# Patient Record
Sex: Female | Born: 2000 | Hispanic: Yes | Marital: Single | Smoking: Never smoker
Health system: Southern US, Community
[De-identification: ages and names within clinical notes are randomized; demographics above are authoritative.]

---
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
## 2022-09-24 ENCOUNTER — Other Ambulatory Visit: Payer: Self-pay

## 2022-09-24 ENCOUNTER — Ambulatory Visit
Admission: EM | Admit: 2022-09-24 | Discharge: 2022-09-24 | Disposition: A | Discharge status: Home / Self Care | Payer: Self-pay

## 2022-09-24 DIAGNOSIS — B070 Plantar wart: Secondary | ICD-10-CM

## 2022-09-24 NOTE — ED Triage Notes (Signed)
Patient to Urgent Care with complaints of left heel pain. Reports that initially she had swelling and pain in her heel, she tried to pop the area but now it is painful to even walk/ stand. Concerned about a possible wart.

## 2022-09-24 NOTE — Discharge Instructions (Addendum)
Follow up with a podiatrist or dermatologist.  [facility] [address], [place], [place] [address] [phone]

## 2022-09-24 NOTE — ED Provider Notes (Signed)
[name]    CSN: [id number] Arrival date & time: 09/24/22  1317      History   Chief Complaint Chief Complaint  Patient presents with   Foot Pain    HPI Brittany Guzman is a 21 y.o. female.  Patient presents with pain on the bottom of her left foot x1 to 2 months.  No trauma.  She is concerned for a possible wart.  The area is painful to bear weight and ambulate.  No drainage, fever, chills, numbness, weakness, paresthesias, or other symptoms.  No treatments at home.  The history is provided by the patient.    History reviewed. No pertinent past medical history.  There are no problems to display for this patient.   History reviewed. No pertinent surgical history.  OB History   No obstetric history on file.      Home Medications    Prior to Admission medications   Not on File    Family History No family history on file.  Social History Social History   Tobacco Use   Smoking status: Never   Smokeless tobacco: Never  Substance Use Topics   Alcohol use: Never   Drug use: Never     Allergies   Patient has no known allergies.   Review of Systems Review of Systems  Constitutional:  Negative for chills and fever.  Musculoskeletal:  Positive for gait problem. Negative for arthralgias and joint swelling.  Skin:  Positive for wound. Negative for color change.  Neurological:  Negative for weakness and numbness.  All other systems reviewed and are negative.    Physical Exam Triage Vital Signs ED Triage Vitals  Enc Vitals Group     BP 09/24/22 1423 123/78     Pulse Rate 09/24/22 1423 69     Resp 09/24/22 1423 18     Temp 09/24/22 1423 98.1 F (36.7 C)     Temp src --      SpO2 09/24/22 1423 98 %     Weight --      Height 09/24/22 1441 5\' 1"  (1.549 m)     Head Circumference --      Peak Flow --      Pain Score 09/24/22 1440 5     Pain Loc --      Pain Edu? --      Excl. in [place]? --    No data found.  Updated Vital Signs BP  123/78   Pulse 69   Temp 98.1 F (36.7 C)   Resp 18   Ht 5\' 1"  (1.549 m)   SpO2 98%   Visual Acuity Right Eye Distance:   Left Eye Distance:   Bilateral Distance:    Right Eye Near:   Left Eye Near:    Bilateral Near:     Physical Exam Vitals and nursing note reviewed.  Constitutional:      General: She is not in acute distress.    Appearance: Normal appearance. She is well-developed. She is not ill-appearing.  HENT:     Mouth/Throat:     Mouth: Mucous membranes are moist.  Cardiovascular:     Rate and Rhythm: Normal rate and regular rhythm.     Heart sounds: Normal heart sounds.  Pulmonary:     Effort: Pulmonary effort is normal. No respiratory distress.     Breath sounds: Normal breath sounds.  Musculoskeletal:        General: No swelling or deformity. Normal range of motion.  Cervical back: Neck supple.       Feet:  Skin:    General: Skin is warm and dry.     Capillary Refill: Capillary refill takes less than 2 seconds.     Findings: Lesion present.     Comments: 1 cm circular plantar wart on left foot. No open wound or drainage.   Neurological:     General: No focal deficit present.     Mental Status: She is alert and oriented to person, place, and time.     Sensory: No sensory deficit.     Motor: No weakness.     Gait: Gait normal.  Psychiatric:        Mood and Affect: Mood normal.        Behavior: Behavior normal.      UC Treatments / Results  Labs (all labs ordered are listed, but only abnormal results are displayed) Labs Reviewed - No data to display  EKG   Radiology No results found.  Procedures Procedures (including critical care time)  Medications Ordered in UC Medications - No data to display  Initial Impression / Assessment and Plan / UC Course  I have reviewed the triage vital signs and the nursing notes.  Pertinent labs & imaging results that were available during my care of the patient were reviewed by me and considered in  my medical decision making (see chart for details).   Plantar wart of left foot.  No indication of infection.  Education provided on plantar warts.  Instructed patient to follow-up with a podiatrist or dermatologist.  He agrees to plan of care.  Final Clinical Impressions(s) / UC Diagnoses   Final diagnoses:  Plantar wart of left foot     Discharge Instructions      Follow up with a podiatrist or dermatologist.  [facility] [address] [place], [place] [address] [phone]     ED Prescriptions   None    PDMP not reviewed this encounter.   Mickie Bail, NP 09/24/22 1520

## 2022-10-17 ENCOUNTER — Ambulatory Visit (INDEPENDENT_AMBULATORY_CARE_PROVIDER_SITE_OTHER): Payer: Self-pay | Admitting: Podiatry

## 2022-10-17 DIAGNOSIS — B070 Plantar wart: Secondary | ICD-10-CM

## 2022-10-17 NOTE — Progress Notes (Signed)
  Subjective:  Patient ID: Brittany Guzman, female    DOB: Dec 09, 2001,  MRN: [id number]  Chief Complaint  Patient presents with   Plantar Warts    21 y.o. female presents with the above complaint.  Patient presents with left submetatarsal fifth metatarsal base plantar verruca.  Patient states that it is painful to touch is progressive gotten worse she has tried some over-the-counter therapy none of which has helped.  She wanted to get it evaluated.  Hurts with ambulation hurts with pressure.   Review of Systems: Negative except as noted in the HPI. Denies N/V/F/Ch.  No past medical history on file. No current outpatient medications on file.  Social History   Tobacco Use  Smoking Status Never  Smokeless Tobacco Never    No Known Allergies Objective:  There were no vitals filed for this visit. There is no height or weight on file to calculate BMI. Constitutional Well developed. Well nourished.  Vascular Dorsalis pedis pulses palpable bilaterally. Posterior tibial pulses palpable bilaterally. Capillary refill normal to all digits.  No cyanosis or clubbing noted. Pedal hair growth normal.  Neurologic Normal speech. Oriented to person, place, and time. Epicritic sensation to light touch grossly present bilaterally.  Dermatologic Hyperkeratotic lesion with pinpoint bleeding noted to fifth metatarsal base.  Mild pain on palpation  Orthopedic: Normal joint ROM without pain or crepitus bilaterally. No visible deformities. No bony tenderness.   Radiographs: None Assessment:   1. Plantar verruca    Plan:  Patient was evaluated and treated and all questions answered.  Left fifth metatarsal base plantar verruca --Lesion was debrided today without complications. Hemostasis was achieved and the area was cleaned. Cantharone was applied followed by an occlusive bandage. Post procedure complications were discussed. Monitor for signs or symptoms of infection and directed to call the  office mainly should any occur. -Today's 1 of 3 application  No follow-ups on file.

## 2022-11-05 ENCOUNTER — Ambulatory Visit (INDEPENDENT_AMBULATORY_CARE_PROVIDER_SITE_OTHER): Payer: Medicaid Other | Admitting: Podiatry

## 2022-11-05 DIAGNOSIS — B070 Plantar wart: Secondary | ICD-10-CM

## 2022-11-05 NOTE — Progress Notes (Signed)
  Subjective:  Patient ID: Brittany Guzman, female    DOB: 2001/11/12,  MRN: [id number]  Chief Complaint  Patient presents with   Plantar Warts    21 y.o. female presents with the above complaint.  Patient presents for follow-up of left submetatarsal 5 plantar verruca.  She states she is doing a lot better no further pain.  She had reaction to [facility] therapy which helped.   Review of Systems: Negative except as noted in the HPI. Denies N/V/F/Ch.  No past medical history on file. No current outpatient medications on file.  Social History   Tobacco Use  Smoking Status Never  Smokeless Tobacco Never    No Known Allergies Objective:  There were no vitals filed for this visit. There is no height or weight on file to calculate BMI. Constitutional Well developed. Well nourished.  Vascular Dorsalis pedis pulses palpable bilaterally. Posterior tibial pulses palpable bilaterally. Capillary refill normal to all digits.  No cyanosis or clubbing noted. Pedal hair growth normal.  Neurologic Normal speech. Oriented to person, place, and time. Epicritic sensation to light touch grossly present bilaterally.  Dermatologic Hyperkeratotic lesion with pinpoint bleeding noted to fifth metatarsal base.  Mild pain on palpation  Orthopedic: Normal joint ROM without pain or crepitus bilaterally. No visible deformities. No bony tenderness.   Radiographs: None Assessment:   No diagnosis found.  Plan:  Patient was evaluated and treated and all questions answered.  Left fifth metatarsal base plantar verruca -- Clinically healed no further signs of plantar verruca noted.  No complication noted.  At this time I discussed shoe gear modification prevention technique if any foot and ankle issues on future advised her to come back and see me.  She states understanding  No follow-ups on file.

## 2024-03-08 ENCOUNTER — Ambulatory Visit
Admission: EM | Admit: 2024-03-08 | Discharge: 2024-03-08 | Disposition: A | Discharge status: Home / Self Care | Attending: Emergency Medicine | Admitting: Emergency Medicine

## 2024-03-08 DIAGNOSIS — H6691 Otitis media, unspecified, right ear: Secondary | ICD-10-CM

## 2024-03-08 MED ORDER — AMOXICILLIN 875 MG PO TABS: 875.0000 mg | ORAL_TABLET | Freq: Two times a day (BID) | ORAL | 0 refills | Status: DC

## 2024-03-08 NOTE — ED Provider Notes (Signed)
 [facility]    CSN: [id number] Arrival date & time: 03/08/24  1722      History   Chief Complaint Chief Complaint  Patient presents with   Otalgia    HPI Brittany Guzman is a 23 y.o. female.  Patient presents with 2 to 3-day history of right ear pain.  Treatment attempted with Advil.  No fever, sore throat, cough, shortness of breath.  No pertinent medical history.  The history is provided by the patient and medical records.    History reviewed. No pertinent past medical history.  There are no active problems to display for this patient.   History reviewed. No pertinent surgical history.  OB History   No obstetric history on file.      Home Medications    Prior to Admission medications   Medication Sig Start Date End Date Taking? Authorizing Provider  amoxicillin (AMOXIL) 875 MG tablet Take 1 tablet (875 mg total) by mouth 2 (two) times daily for 10 days. 03/08/24 03/18/24 Yes Mickie Bail, NP    Family History History reviewed. No pertinent family history.  Social History Social History   Tobacco Use   Smoking status: Never   Smokeless tobacco: Never  Substance Use Topics   Alcohol use: Never   Drug use: Never     Allergies   Patient has no known allergies.   Review of Systems Review of Systems  Constitutional:  Negative for chills and fever.  HENT:  Positive for ear pain. Negative for sore throat.   Respiratory:  Negative for cough and shortness of breath.      Physical Exam Triage Vital Signs ED Triage Vitals  Encounter Vitals Group     BP 03/08/24 1808 122/78     Systolic BP Percentile --      Diastolic BP Percentile --      Pulse Rate 03/08/24 1808 81     Resp 03/08/24 1808 18     Temp 03/08/24 1808 98 F (36.7 C)     Temp src --      SpO2 03/08/24 1808 97 %     Weight --      Height --      Head Circumference --      Peak Flow --      Pain Score 03/08/24 1807 10     Pain Loc --      Pain Education --       Exclude from Growth Chart --    No data found.  Updated Vital Signs BP 122/78   Pulse 81   Temp 98 F (36.7 C)   Resp 18   SpO2 97%   Visual Acuity Right Eye Distance:   Left Eye Distance:   Bilateral Distance:    Right Eye Near:   Left Eye Near:    Bilateral Near:     Physical Exam Constitutional:      General: She is not in acute distress. HENT:     Right Ear: Tympanic membrane is erythematous.     Left Ear: Tympanic membrane normal.     Nose: Nose normal.     Mouth/Throat:     Mouth: Mucous membranes are moist.     Pharynx: Oropharynx is clear.  Cardiovascular:     Rate and Rhythm: Normal rate and regular rhythm.     Heart sounds: Normal heart sounds.  Pulmonary:     Effort: Pulmonary effort is normal. No respiratory distress.  Breath sounds: Normal breath sounds.  Neurological:     Mental Status: She is alert.      UC Treatments / Results  Labs (all labs ordered are listed, but only abnormal results are displayed) Labs Reviewed - No data to display  EKG   Radiology No results found.  Procedures Procedures (including critical care time)  Medications Ordered in UC Medications - No data to display  Initial Impression / Assessment and Plan / UC Course  I have reviewed the triage vital signs and the nursing notes.  Pertinent labs & imaging results that were available during my care of the patient were reviewed by me and considered in my medical decision making (see chart for details).    Right otitis media.  Treating with amoxicillin.  Tylenol or ibuprofen as needed.  Education provided on otitis media.  Instructed patient to follow up with her PCP if her symptoms are not improving.  She agrees to plan of care.   Final Clinical Impressions(s) / UC Diagnoses   Final diagnoses:  Right otitis media, unspecified otitis media type     Discharge Instructions      Take the amoxicillin as directed.  Follow-up with your primary care provider if  your symptoms are not improving.      ED Prescriptions     Medication Sig Dispense Auth. Provider   amoxicillin (AMOXIL) 875 MG tablet Take 1 tablet (875 mg total) by mouth 2 (two) times daily for 10 days. 20 tablet Mickie Bail, NP      PDMP not reviewed this encounter.   Mickie Bail, NP 03/08/24 [phone]

## 2024-03-08 NOTE — ED Triage Notes (Signed)
 Patient to Urgent Care with complaints of right sided ear pain.   Symptoms started 2-3 days ago. Denies any known fevers.   Meds: Ddvil

## 2024-03-08 NOTE — Discharge Instructions (Addendum)
 Take the amoxicillin as directed.  Follow up with your primary care provider if your symptoms are not improving.

## 2024-03-09 ENCOUNTER — Emergency Department
Admission: EM | Admit: 2024-03-09 | Discharge: 2024-03-09 | Disposition: A | Discharge status: Home / Self Care | Attending: Emergency Medicine | Admitting: Emergency Medicine

## 2024-03-09 ENCOUNTER — Other Ambulatory Visit: Payer: Self-pay

## 2024-03-09 DIAGNOSIS — H66009 Acute suppurative otitis media without spontaneous rupture of ear drum, unspecified ear: Secondary | ICD-10-CM

## 2024-03-09 DIAGNOSIS — H66001 Acute suppurative otitis media without spontaneous rupture of ear drum, right ear: Secondary | ICD-10-CM | POA: Diagnosis not present

## 2024-03-09 DIAGNOSIS — H9201 Otalgia, right ear: Secondary | ICD-10-CM | POA: Diagnosis present

## 2024-03-09 MED ORDER — CEPHALEXIN 500 MG PO CAPS
500.0000 mg | ORAL_CAPSULE | Freq: Once | ORAL | Status: AC
  Administered 2024-03-09: 500 mg via ORAL
  Filled 2024-03-09: qty 1

## 2024-03-09 MED ORDER — ONDANSETRON 8 MG PO TBDP
8.0000 mg | ORAL_TABLET | Freq: Once | ORAL | Status: AC
  Administered 2024-03-09: 8 mg via ORAL
  Filled 2024-03-09: qty 1

## 2024-03-09 MED ORDER — AMOXICILLIN-POT CLAVULANATE ER 1000-62.5 MG PO TB12: 1.0000 | ORAL_TABLET | Freq: Two times a day (BID) | ORAL | 0 refills | Status: AC

## 2024-03-09 MED ORDER — ONDANSETRON 4 MG PO TBDP: 4.0000 mg | ORAL_TABLET | Freq: Three times a day (TID) | ORAL | 0 refills | Status: AC | PRN

## 2024-03-09 NOTE — ED Triage Notes (Signed)
 Pt to ED via POV c/o right ear pain x3 days. Went to UC yesterday and was prescribed medication but hasn't been able to go get it. Pt having right ear fullness, hot/cold chills, and nausea. Pain has gotten worse.

## 2024-03-09 NOTE — Discharge Instructions (Addendum)
 You have been diagnosed with acute suppurative right otitis media, and acute left otitis media.  Please take Augmentin 1 tablet by mouth 2 times daily for 7 days.  Please take Zofran 1 tablet by mouth 20 minutes before main meals to prevent nausea and vomit.  Please come back to ED or go to your PCP if you have new symptoms or symptoms worsen

## 2024-03-09 NOTE — ED Provider Notes (Signed)
 [facility] Provider Note    Event Date/Time   First MD Initiated Contact with Patient 03/09/24 2114     (approximate)   History   Otalgia   HPI  Brittany Guzman is a 23 y.o. female who presents today with history of 24 hours of right otalgia.  Patient was seen yesterday in urgent care, got diagnosed with right otitis media, prescribed amoxicillin but patient was unable to pick it up.  Today patient states having nauseas, vomit and dizziness.  Patient is taking ibuprofen     Physical Exam   Triage Vital Signs: ED Triage Vitals  Encounter Vitals Group     BP 03/09/24 2102 123/79     Systolic BP Percentile --      Diastolic BP Percentile --      Pulse Rate 03/09/24 2102 73     Resp 03/09/24 2102 18     Temp 03/09/24 2102 97.6 F (36.4 C)     Temp Source 03/09/24 2102 Oral     SpO2 03/09/24 2102 100 %     Weight --      Height 03/09/24 2100 5\' 1"  (1.549 m)     Head Circumference --      Peak Flow --      Pain Score 03/09/24 2100 10     Pain Loc --      Pain Education --      Exclude from Growth Chart --     Most recent vital signs: Vitals:   03/09/24 2102  BP: 123/79  Pulse: 73  Resp: 18  Temp: 97.6 F (36.4 C)  SpO2: 100%     Constitutional: Alert, NAD. Able to speak in complete sentences without cough or dyspnea  Eyes: Conjunctivae are normal.  Head: Atraumatic. Nose: No congestion/rhinnorhea. Right ear: Presence of purulent secretion.  Tympanic membrane perforated. Left ear: Tympanic membrane with white color.  No normal reflex of  light Mouth/Throat: Mucous membranes are moist.   Neck: Painless ROM. Supple. No JVD, nodes, thyromegaly  Cardiovascular:   Good peripheral circulation.RRR no murmurs, gallops, rubs  Respiratory: Normal respiratory effort.  No retractions. Clear to auscultation bilaterally without wheezing or crackles  Gastrointestinal: Soft and nontender.  Musculoskeletal:  no deformity Neurologic:  MAE  spontaneously. No gross focal neurologic deficits are appreciated.  Skin:  Skin is warm, dry and intact. No rash noted. Psychiatric: Mood and affect are normal. Speech and behavior are normal.    ED Results / Procedures / Treatments   Labs (all labs ordered are listed, but only abnormal results are displayed) Labs Reviewed - No data to display   EKG     RADIOLOGY     PROCEDURES:  Critical Care performed:   Procedures   MEDICATIONS ORDERED IN ED: Medications  ondansetron (ZOFRAN-ODT) disintegrating tablet 8 mg (8 mg Oral Given 03/09/24 2215)  cephALEXin (KEFLEX) capsule 500 mg (500 mg Oral Given 03/09/24 2215)      IMPRESSION / MDM / ASSESSMENT AND PLAN / ED COURSE  I reviewed the triage vital signs and the nursing notes.  Differential diagnosis includes, but is not limited to, otitis media, otitis externa, upper respiratory infection  Patient's presentation is most consistent with acute, uncomplicated illness.   Patient's diagnosis is consistent with right suppurative otitis media, left otitis media. I did review the patient's allergies and medications.The patient is in stable and satisfactory condition for discharge home  Patient will be discharged home with prescriptions for Augmentin, Zofran.  Patient is to follow up with PCP as needed or otherwise directed. Patient is given ED precautions to return to the ED for any worsening or new symptoms. Discussed plan of care with patient, answered all of patient's questions, Patient agreeable to plan of care. Advised patient to take medications according to the instructions on the label. Discussed possible side effects of new medications. Patient verbalized understanding.    FINAL CLINICAL IMPRESSION(S) / ED DIAGNOSES   Final diagnoses:  Acute suppurative otitis media without spontaneous rupture of ear drum, recurrence not specified, unspecified laterality     Rx / DC Orders   ED Discharge Orders          Ordered     amoxicillin-clavulanate (AUGMENTIN XR) 1000-62.5 MG 12 hr tablet  2 times daily        03/09/24 2216    ondansetron (ZOFRAN-ODT) 4 MG disintegrating tablet  Every 8 hours PRN        03/09/24 2216             Note:  This document was prepared using Dragon voice recognition software and may include unintentional dictation errors.   Gladys Damme, PA-C 03/09/24 2217    Chesley Noon, MD 03/09/24 [phone]

## 2024-09-22 NOTE — Progress Notes (Deleted)
    GYNECOLOGY PROGRESS NOTE  Subjective:    Patient ID: Brittany Guzman, female    DOB: [birth year], 23 y.o.   MRN: [id number]  HPI  Patient is a 23 y.o. No obstetric history on file. female who presents for evaluation of IUD. She has been having some complications with her IUD.  {Common ambulatory SmartLinks:19316}  Review of Systems {ros; complete:30496}   Objective:   There were no vitals taken for this visit. There is no height or weight on file to calculate BMI. General appearance: {general exam:16600} Abdomen: {abdominal exam:16834} Pelvic: {pelvic exam:16852::cervix normal in appearance,external genitalia normal,no adnexal masses or tenderness,no cervical motion tenderness,rectovaginal septum normal,uterus normal size, shape, and consistency,vagina normal without discharge} Extremities: {extremity exam:5109} Neurologic: {neuro exam:17854}   Assessment:   No diagnosis found.   Plan:   There are no diagnoses linked to this encounter.    Zelda Hummer, CNM  OB/GYN

## 2024-09-24 ENCOUNTER — Encounter: Admitting: Certified Nurse Midwife
